# Patient Record
Sex: Female | Born: 1955 | Race: Black or African American | Hispanic: No | Marital: Married | Smoking: Former smoker
Health system: Southern US, Community
[De-identification: ages and names within clinical notes are randomized; demographics above are authoritative.]

## PROBLEM LIST (undated history)

## (undated) DIAGNOSIS — K219 Gastro-esophageal reflux disease without esophagitis: Secondary | ICD-10-CM

---

## 1999-06-06 ENCOUNTER — Other Ambulatory Visit: Admission: RE | Admit: 1999-06-06 | Discharge: 1999-06-06 | Payer: Self-pay | Admitting: Obstetrics & Gynecology

## 1999-09-30 ENCOUNTER — Encounter: Admission: RE | Admit: 1999-09-30 | Discharge: 1999-12-29 | Payer: Self-pay | Admitting: Obstetrics & Gynecology

## 1999-11-23 ENCOUNTER — Encounter (INDEPENDENT_AMBULATORY_CARE_PROVIDER_SITE_OTHER): Payer: Self-pay

## 1999-11-23 ENCOUNTER — Inpatient Hospital Stay (HOSPITAL_COMMUNITY): Admission: AD | Admit: 1999-11-23 | Discharge: 1999-11-26 | Payer: Self-pay | Admitting: Obstetrics & Gynecology

## 1999-11-24 ENCOUNTER — Encounter: Payer: Self-pay | Admitting: Obstetrics and Gynecology

## 2000-08-31 ENCOUNTER — Other Ambulatory Visit: Admission: RE | Admit: 2000-08-31 | Discharge: 2000-08-31 | Payer: Self-pay | Admitting: Obstetrics & Gynecology

## 2001-11-08 ENCOUNTER — Other Ambulatory Visit: Admission: RE | Admit: 2001-11-08 | Discharge: 2001-11-08 | Payer: Self-pay | Admitting: Obstetrics & Gynecology

## 2002-04-13 ENCOUNTER — Emergency Department (HOSPITAL_COMMUNITY): Admission: EM | Admit: 2002-04-13 | Discharge: 2002-04-14 | Payer: Self-pay | Admitting: Emergency Medicine

## 2003-11-13 ENCOUNTER — Other Ambulatory Visit: Admission: RE | Admit: 2003-11-13 | Discharge: 2003-11-13 | Payer: Self-pay | Admitting: Obstetrics & Gynecology

## 2005-10-20 ENCOUNTER — Other Ambulatory Visit: Admission: RE | Admit: 2005-10-20 | Discharge: 2005-10-20 | Payer: Self-pay | Admitting: Obstetrics & Gynecology

## 2009-04-15 ENCOUNTER — Encounter: Admission: RE | Admit: 2009-04-15 | Discharge: 2009-05-21 | Payer: Self-pay | Admitting: Family Medicine

## 2011-03-17 NOTE — Op Note (Signed)
[facility]  Patient:    Rebecca Gill                       MRN: [id number] Proc. Date: 11/23/99 Adm. Date:  [id number] Attending:  Mickle Mallory                           Operative Report  PREOPERATIVE DIAGNOSIS:       Previous cesarean section, voluntary sterilization.  POSTOPERATIVE DIAGNOSIS:      Previous cesarean section, voluntary sterilization.  OPERATION:                    Repeat low transverse cesarean section and bilateral partial salpingectomy for sterilization.  SURGEON:                      Richard D. Arlyce Dice, M.D.  ASSISTANT:                    Luvenia Redden, M.D.  ANESTHESIA:                   Spinal.  ESTIMATED BLOOD LOSS:         500 cc.  FINDINGS:                     A female infant with Apgars of 9 and 9, birth weight 8 pounds 7 ounces, clear amniotic fluid.  Normal appearing tubes and ovaries. Small 1 to 2 cm myoma at the fundus of the uterus.  INDICATIONS:                  This is a 55 year old, gravida 4, para 1, abortus 2, with an estimated date of confinement of February 2, who was admitted for a repeat cesarean section.  The possibility of vaginal birth after cesarean section was discussed with the patient who refused to accept the risks of doing this.  In addition, the patient requests that she have a tubal ligation.  The fact that this was permanent, that the failure rate was approximately 5 per 1000 and that there were nonpermanent alternatives available to the patient was carefully discussed  with her several times prior to surgery.  DESCRIPTION OF PROCEDURE:     The patient was taken to the operating room and spinal anesthesia was placed.  She was then placed in the supine position with eft lateral displacement of the uterus.  The abdomen was prepped and draped in a sterile fashion.  A low vertical incision was made removing the previous low vertical scar.  This incision was carried down to  the fascia.  The fascia was extended vertically.  The rectus muscle was divided in the midline and the rectus sheath and peritoneum were entered by sharp and blunt dissection and this incision was extended vertically.  The lower segment was identified.  Incision was made.  The bladder was displaced inferiorly. The incision was then carried down to the  amniotic cavity and extended with bandage scissors.  The infant was then delivered without difficulty.  Cord bloods were obtained and the placenta was delivered with traction on the cord.  The uterus was bluntly curettaged.  The lower segment was closed with a single layer of running interlocking Vicryl 1 suture.  The left tube was then brought into the incision, grasped with a Kelly clamp, and doubly ligated at the  isthmic/ampullary junction removing a 2 to 3 cm knuckle of tube. Identical procedure was then carried out on the contralateral tube.  The fascia and peritoneum were closed with a running Smead-Jones type closure.  The subcutaneous tissue was reopposed with plain catgut suture and the skin was closed with staples. The patient tolerated the procedure well and left the operating room in good condition. DD:  11/23/99 TD:  11/23/99 Job: [id number]

## 2011-03-17 NOTE — Discharge Summary (Signed)
[facility]  Patient:    Rebecca Gill                       MRN: [id number] Adm. Date:  [id number] Disc. Date: [id number] Attending:  Mickle Mallory Dictator:   Leilani Able, P.A.                           Discharge Summary  FINAL DIAGNOSES:              1. Previous cesarean section, desires repeat cesarean                                  section.                               2. Desires voluntary sterilization.  PROCEDURE:                    Repeat low transverse cesarean section and bilateral partial salpingectomy for sterilization.  SURGEON:                      Richard D. Arlyce Dice, M.D.  ASSISTANT:                    Luvenia Redden, M.D.  COMPLICATIONS:                None.  HISTORY OF PRESENT ILLNESS:   This 55 year old, gravida 4, para 1, was admitted on November 23, 1999, for a repeat cesarean section.  The patient had refused a VBAC and she also requested tubal ligation after this pregnancy.  The risks were discussed with the patient.  HOSPITAL COURSE:              She was taken to the operating room on November 23, 1999, by Caralyn Guile. Arlyce Dice, M.D. where repeat low transverse cesarean section was performed with delivery of an 8 pound 7 ounce female infant with Apgars of 9 and 9. Delivery went without complications.  At this point a bilateral partial salpingectomy for sterilization was performed without complications.  There was a small myoma at the fundus of the uterus noted.  The procedure went without complications.  The patients postoperative course, the patient did have some problems with shortness of breath on her postoperative course.  She was given some Lasix.  There was some serous etiology thought to be connected with the shortness of breath.  She started feeling better.  Her lungs were clear to auscultation. She had a normal cardiac examination.  She was felt ready for discharge on postoperative day #3.  She  was sent home on a regular diet, told to decrease activities.  She was given Tylox #25 one to two every four hours as needed for pain.  She was given Keflex 500 mg one q.i.d. x 7 days for her incision.  She was told to put some heat to her incision because there was some slight erythema noted and she was also to return to the office in 48 hours for a wound check.  DISCHARGE LABORATORY DATA:    Therapy had a hemoglobin of 11.9, white blood cell count 12.2. DD:  12/26/99 TD:  12/26/99 Job: [id number]

## 2013-10-15 ENCOUNTER — Other Ambulatory Visit: Payer: Self-pay | Admitting: Oral Surgery

## 2013-10-15 DIAGNOSIS — M26629 Arthralgia of temporomandibular joint, unspecified side: Secondary | ICD-10-CM

## 2013-10-27 ENCOUNTER — Ambulatory Visit
Admission: RE | Admit: 2013-10-27 | Discharge: 2013-10-27 | Disposition: A | Discharge status: Home / Self Care | Payer: 59 | Source: Ambulatory Visit | Attending: Oral Surgery | Admitting: Oral Surgery

## 2013-10-27 DIAGNOSIS — M26629 Arthralgia of temporomandibular joint, unspecified side: Secondary | ICD-10-CM

## 2015-02-27 ENCOUNTER — Encounter (HOSPITAL_COMMUNITY): Payer: Self-pay

## 2015-02-27 ENCOUNTER — Emergency Department (HOSPITAL_COMMUNITY)
Admission: EM | Admit: 2015-02-27 | Discharge: 2015-02-27 | Disposition: A | Discharge status: Home / Self Care | Payer: 59 | Attending: Emergency Medicine | Admitting: Emergency Medicine

## 2015-02-27 DIAGNOSIS — L509 Urticaria, unspecified: Secondary | ICD-10-CM | POA: Diagnosis not present

## 2015-02-27 DIAGNOSIS — Z8719 Personal history of other diseases of the digestive system: Secondary | ICD-10-CM | POA: Diagnosis not present

## 2015-02-27 DIAGNOSIS — Z87891 Personal history of nicotine dependence: Secondary | ICD-10-CM | POA: Diagnosis not present

## 2015-02-27 DIAGNOSIS — R21 Rash and other nonspecific skin eruption: Secondary | ICD-10-CM | POA: Diagnosis present

## 2015-02-27 HISTORY — DX: Gastro-esophageal reflux disease without esophagitis: K21.9

## 2015-02-27 MED ORDER — FAMOTIDINE 20 MG PO TABS: 20.0000 mg | ORAL_TABLET | Freq: Two times a day (BID) | ORAL | Status: AC

## 2015-02-27 MED ORDER — FAMOTIDINE 20 MG PO TABS
20.0000 mg | ORAL_TABLET | ORAL | Status: AC
  Administered 2015-02-27: 20 mg via ORAL
  Filled 2015-02-27: qty 1

## 2015-02-27 MED ORDER — PREDNISONE 20 MG PO TABS
60.0000 mg | ORAL_TABLET | Freq: Once | ORAL | Status: AC
  Administered 2015-02-27: 60 mg via ORAL
  Filled 2015-02-27: qty 3

## 2015-02-27 MED ORDER — PREDNISONE 10 MG PO TABS: 20.0000 mg | ORAL_TABLET | Freq: Every day | ORAL | Status: AC

## 2015-02-27 NOTE — ED Notes (Signed)
Pt presents with c/o hives that started today. Pt reports the hives started on her leg and are now showing up on her arms and chest and stomach. Pt reports no new foods or detergents. Pt in no respiratory distress.

## 2015-02-27 NOTE — ED Provider Notes (Signed)
CSN: [id number]     Arrival date & time 02/27/15  2021 History  This chart was scribed for Earley FavorGail Maday Guarino, NP working with Azalia BilisKevin Campos, MD by Evon Slackerrance Branch, ED Scribe. This patient was seen in room WTR6/WTR6 and the patient's care was started at 8:33 PM.     Chief Complaint  Patient presents with  . Rash   The history is provided by the patient. No language interpreter was used.   HPI Comments: Rebecca CarlCheryl C Knies is a 59 y.o. female who presents to the Emergency Department complaining of new itchy rash onset this morning. Pt states she has hives that started in her groin area and have spread to her torso and arms. Pt states she has tried benadryl with temporary relief.  Pt denies any new foods or detergents. Pt denies fever SOB or trouble swallowing.   Past Medical History  Diagnosis Date  . GERD (gastroesophageal reflux disease)    History reviewed. No pertinent past surgical history. No family history on file. History  Substance Use Topics  . Smoking status: Former Games developermoker  . Smokeless tobacco: Not on file  . Alcohol Use: Yes     Comment: occasionally    OB History    No data available      Review of Systems  Constitutional: Negative for fever.  HENT: Negative for trouble swallowing and voice change.   Respiratory: Negative for shortness of breath.   Skin: Positive for rash.  All other systems reviewed and are negative.    Allergies  Review of patient's allergies indicates no known allergies.  Home Medications   Prior to Admission medications   Medication Sig Start Date End Date Taking? Authorizing Provider  famotidine (PEPCID) 20 MG tablet Take 1 tablet (20 mg total) by mouth 2 (two) times daily. 02/27/15   Earley FavorGail Jermell Holeman, NP  predniSONE (DELTASONE) 10 MG tablet Take 2 tablets (20 mg total) by mouth daily with breakfast. 02/27/15   Earley FavorGail Jannatul Wojdyla, NP   BP 149/81 mmHg  Pulse 91  Temp(Src) 98.5 F (36.9 C) (Oral)  Resp 18  SpO2 97%   Physical Exam  Constitutional: She is  oriented to person, place, and time. She appears well-developed and well-nourished. No distress.  HENT:  Head: Normocephalic and atraumatic.  Mouth/Throat: Oropharynx is clear and moist. No oropharyngeal exudate.  Eyes: Conjunctivae and EOM are normal.  Neck: Neck supple. No tracheal deviation present.  Cardiovascular: Normal rate.   Pulmonary/Chest: Effort normal. No respiratory distress.  Musculoskeletal: Normal range of motion.  Neurological: She is alert and oriented to person, place, and time.  Skin: Skin is warm and dry.  Psychiatric: She has a normal mood and affect. Her behavior is normal.  Nursing note and vitals reviewed.   ED Course  Procedures (including critical care time) DIAGNOSTIC STUDIES: Oxygen Saturation is 97% on RA, normal by my interpretation.    COORDINATION OF CARE: 8:50 PM-Discussed treatment plan with pt at bedside and pt agreed to plan.     Labs Review Labs Reviewed - No data to display  Imaging Review No results found.   EKG Interpretation None      MDM   Final diagnoses:  Hives      I personally performed the services described in this documentation, which was scribed in my presence. The recorded information has been reviewed and is accurate.    Earley FavorGail Emogene Muratalla, NP 02/27/15 2128  Azalia BilisKevin Campos, MD 02/27/15 [phone]

## 2015-02-27 NOTE — Discharge Instructions (Signed)
Return for shortness of breath, rapid heart rate, nausea, vomiting, abdominal pain, voice change, tongue swelling

## 2015-02-28 ENCOUNTER — Encounter (HOSPITAL_COMMUNITY): Payer: Self-pay

## 2015-02-28 ENCOUNTER — Emergency Department (HOSPITAL_COMMUNITY)
Admission: EM | Admit: 2015-02-28 | Discharge: 2015-02-28 | Disposition: A | Discharge status: Home / Self Care | Payer: 59 | Attending: Emergency Medicine | Admitting: Emergency Medicine

## 2015-02-28 DIAGNOSIS — X58XXXA Exposure to other specified factors, initial encounter: Secondary | ICD-10-CM | POA: Insufficient documentation

## 2015-02-28 DIAGNOSIS — L500 Allergic urticaria: Secondary | ICD-10-CM | POA: Insufficient documentation

## 2015-02-28 DIAGNOSIS — Z7952 Long term (current) use of systemic steroids: Secondary | ICD-10-CM | POA: Diagnosis not present

## 2015-02-28 DIAGNOSIS — Z79899 Other long term (current) drug therapy: Secondary | ICD-10-CM | POA: Insufficient documentation

## 2015-02-28 DIAGNOSIS — Y9289 Other specified places as the place of occurrence of the external cause: Secondary | ICD-10-CM | POA: Diagnosis not present

## 2015-02-28 DIAGNOSIS — K219 Gastro-esophageal reflux disease without esophagitis: Secondary | ICD-10-CM | POA: Insufficient documentation

## 2015-02-28 DIAGNOSIS — Y9389 Activity, other specified: Secondary | ICD-10-CM | POA: Diagnosis not present

## 2015-02-28 DIAGNOSIS — Y998 Other external cause status: Secondary | ICD-10-CM | POA: Insufficient documentation

## 2015-02-28 DIAGNOSIS — T7840XA Allergy, unspecified, initial encounter: Secondary | ICD-10-CM | POA: Diagnosis not present

## 2015-02-28 DIAGNOSIS — Z87891 Personal history of nicotine dependence: Secondary | ICD-10-CM | POA: Insufficient documentation

## 2015-02-28 MED ORDER — RANITIDINE HCL 150 MG/10ML PO SYRP
150.0000 mg | ORAL_SOLUTION | Freq: Once | ORAL | Status: AC
  Administered 2015-02-28: 150 mg via ORAL
  Filled 2015-02-28: qty 10

## 2015-02-28 MED ORDER — DIPHENHYDRAMINE HCL 50 MG/ML IJ SOLN
50.0000 mg | Freq: Once | INTRAMUSCULAR | Status: AC
  Administered 2015-02-28: 50 mg via INTRAVENOUS
  Filled 2015-02-28: qty 1

## 2015-02-28 NOTE — ED Notes (Signed)
She has widespread pruritic urticarial rash which is not improving despite steroids/H2 blockers prescribed (was seen here yesterday).  She denies any respiratory sx nor any throat swelling and is in no distress.

## 2015-02-28 NOTE — ED Notes (Signed)
Rash to both legs especially lowere legs.  Reports she just came here last night for same thing.  She reports possibly eating some oriental food that "didn't taste right"

## 2015-02-28 NOTE — ED Provider Notes (Signed)
CSN: [id number]     Arrival date & time 02/28/15  1034 History   First MD Initiated Contact with Patient 02/28/15 1124     Chief Complaint  Patient presents with  . Allergic Reaction     (Consider location/radiation/quality/duration/timing/severity/associated sxs/prior Treatment) HPI Comments: Pt states that she was seen in the er last night and given zantac and prednisone. Pt states that she was doing a little better but then the hives worsened today. Denies any problem swallowing, facial swelling or problems breathing. Has been using benadryl cream but has not had oral or iv benadryl. No fever.  Patient is a 59 y.o. female presenting with allergic reaction. The history is provided by the patient. No language interpreter was used.  Allergic Reaction Presenting symptoms: itching and rash   Severity:  Moderate Prior allergic episodes:  No prior episodes Context: food   Relieved by:  Nothing Ineffective treatments:  OTC ointments and steroids   Past Medical History  Diagnosis Date  . GERD (gastroesophageal reflux disease)    No past surgical history on file. No family history on file. History  Substance Use Topics  . Smoking status: Former Games developermoker  . Smokeless tobacco: Not on file  . Alcohol Use: Yes     Comment: occasionally    OB History    No data available     Review of Systems  Skin: Positive for itching and rash.  All other systems reviewed and are negative.     Allergies  Review of patient's allergies indicates no known allergies.  Home Medications   Prior to Admission medications   Medication Sig Start Date End Date Taking? Authorizing Provider  famotidine (PEPCID) 20 MG tablet Take 1 tablet (20 mg total) by mouth 2 (two) times daily. 02/27/15   Earley FavorGail Schulz, NP  predniSONE (DELTASONE) 10 MG tablet Take 2 tablets (20 mg total) by mouth daily with breakfast. 02/27/15   Earley FavorGail Schulz, NP   BP 102/73 mmHg  Pulse 102  Temp(Src) 97.5 F (36.4 C) (Oral)  Resp 18   SpO2 100% Physical Exam  Constitutional: She is oriented to person, place, and time. She appears well-developed.  HENT:  Right Ear: External ear normal.  Left Ear: External ear normal.  Mouth/Throat: Oropharynx is clear and moist.  Eyes: Conjunctivae and EOM are normal. Pupils are equal, round, and reactive to light.  Cardiovascular: Normal rate and regular rhythm.   Pulmonary/Chest: Effort normal and breath sounds normal.  Musculoskeletal: Normal range of motion.  Neurological: She is alert and oriented to person, place, and time. Coordination normal.  Skin:  Hives noted from the neck down  Nursing note and vitals reviewed.   ED Course  Procedures (including critical care time) Labs Review Labs Reviewed - No data to display  Imaging Review No results found.   EKG Interpretation None      MDM   Final diagnoses:  Allergic reaction, initial encounter    Hives have resolved. Pt has prednisone and zantac home. Discussed use of benadryl with pt   Teressa LowerVrinda Endi Lagman, NP 02/28/15 1351  Pricilla LovelessScott Goldston, MD 03/04/15 2334

## 2015-02-28 NOTE — Discharge Instructions (Signed)
Make sure that you get benadryl for recurrence

## 2015-07-23 ENCOUNTER — Other Ambulatory Visit: Payer: Self-pay | Admitting: Obstetrics & Gynecology

## 2015-07-23 DIAGNOSIS — N644 Mastodynia: Secondary | ICD-10-CM

## 2015-07-27 ENCOUNTER — Ambulatory Visit
Admission: RE | Admit: 2015-07-27 | Discharge: 2015-07-27 | Disposition: A | Discharge status: Home / Self Care | Payer: 59 | Source: Ambulatory Visit | Attending: Obstetrics & Gynecology | Admitting: Obstetrics & Gynecology

## 2015-07-27 DIAGNOSIS — N644 Mastodynia: Secondary | ICD-10-CM

## 2018-10-15 ENCOUNTER — Other Ambulatory Visit: Payer: Self-pay | Admitting: Obstetrics & Gynecology

## 2018-10-15 DIAGNOSIS — N6310 Unspecified lump in the right breast, unspecified quadrant: Secondary | ICD-10-CM

## 2018-10-21 ENCOUNTER — Ambulatory Visit
Admission: RE | Admit: 2018-10-21 | Discharge: 2018-10-21 | Disposition: A | Discharge status: Home / Self Care | Payer: 59 | Source: Ambulatory Visit | Attending: Obstetrics & Gynecology | Admitting: Obstetrics & Gynecology

## 2018-10-21 DIAGNOSIS — N6310 Unspecified lump in the right breast, unspecified quadrant: Secondary | ICD-10-CM

## 2021-04-04 ENCOUNTER — Other Ambulatory Visit: Payer: Self-pay | Admitting: Obstetrics & Gynecology

## 2021-04-04 DIAGNOSIS — Z1231 Encounter for screening mammogram for malignant neoplasm of breast: Secondary | ICD-10-CM

## 2021-04-14 ENCOUNTER — Ambulatory Visit
Admission: RE | Admit: 2021-04-14 | Discharge: 2021-04-14 | Disposition: A | Discharge status: Home / Self Care | Payer: 59 | Source: Ambulatory Visit | Attending: Obstetrics & Gynecology | Admitting: Obstetrics & Gynecology

## 2021-04-14 ENCOUNTER — Other Ambulatory Visit: Payer: Self-pay

## 2021-04-14 DIAGNOSIS — Z1231 Encounter for screening mammogram for malignant neoplasm of breast: Secondary | ICD-10-CM

## 2022-06-21 ENCOUNTER — Other Ambulatory Visit: Payer: Self-pay | Admitting: Obstetrics & Gynecology

## 2022-06-21 DIAGNOSIS — Z1231 Encounter for screening mammogram for malignant neoplasm of breast: Secondary | ICD-10-CM

## 2022-06-23 ENCOUNTER — Ambulatory Visit: Payer: 59

## 2022-07-13 ENCOUNTER — Ambulatory Visit
Admission: RE | Admit: 2022-07-13 | Discharge: 2022-07-13 | Disposition: A | Discharge status: Home / Self Care | Payer: 59 | Source: Ambulatory Visit | Attending: Obstetrics & Gynecology | Admitting: Obstetrics & Gynecology

## 2022-07-13 DIAGNOSIS — Z1231 Encounter for screening mammogram for malignant neoplasm of breast: Secondary | ICD-10-CM

## 2022-12-07 IMAGING — MG MM DIGITAL SCREENING BILAT W/ TOMO AND CAD
6 of 12 series · 6 of 36 positions shown · non-contrast
Comparison: Previous exam(s).

ACR Breast Density Category a: The breast tissue is almost entirely
fatty.

CLINICAL DATA: Screening.

EXAM:
DIGITAL SCREENING BILATERAL MAMMOGRAM WITH TOMOSYNTHESIS AND CAD
TECHNIQUE: Bilateral screening digital craniocaudal and mediolateral oblique
mammograms were obtained. Bilateral screening digital breast
tomosynthesis was performed. The images were evaluated with
computer-aided detection.

[L CC synth-2D (1 of 2)]
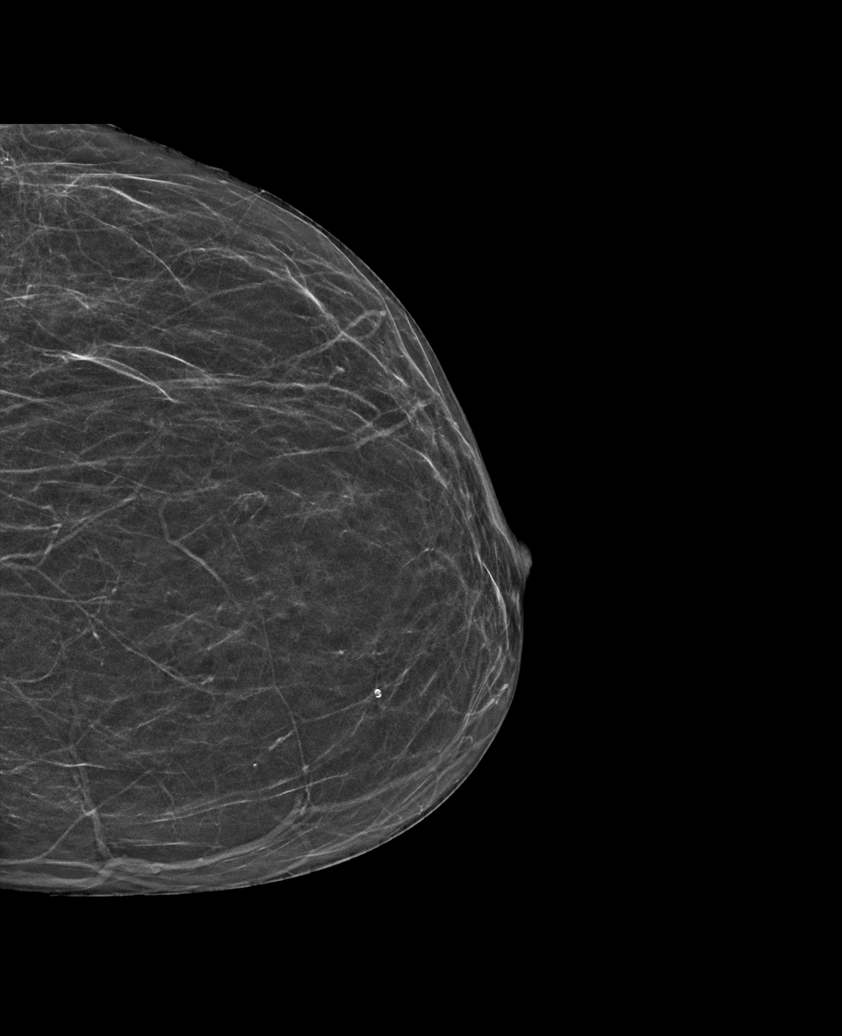

[L CC synth-2D (2 of 2)]
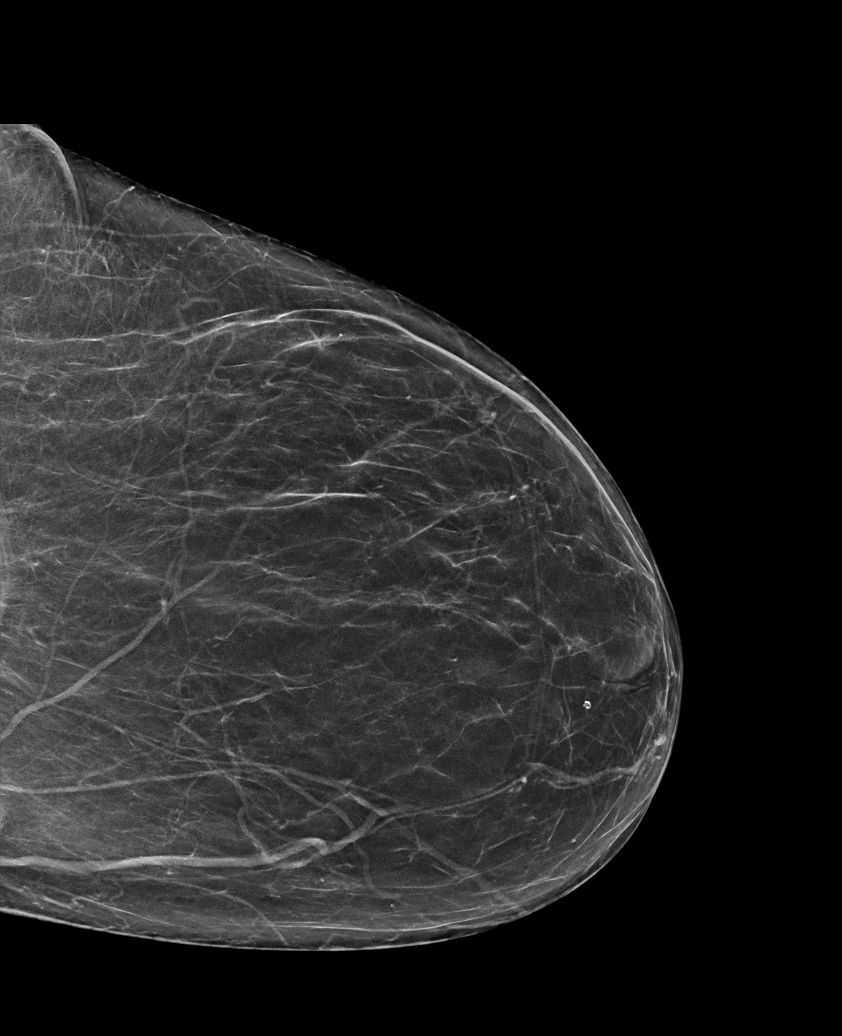

[R CC synth-2D (1 of 2)]
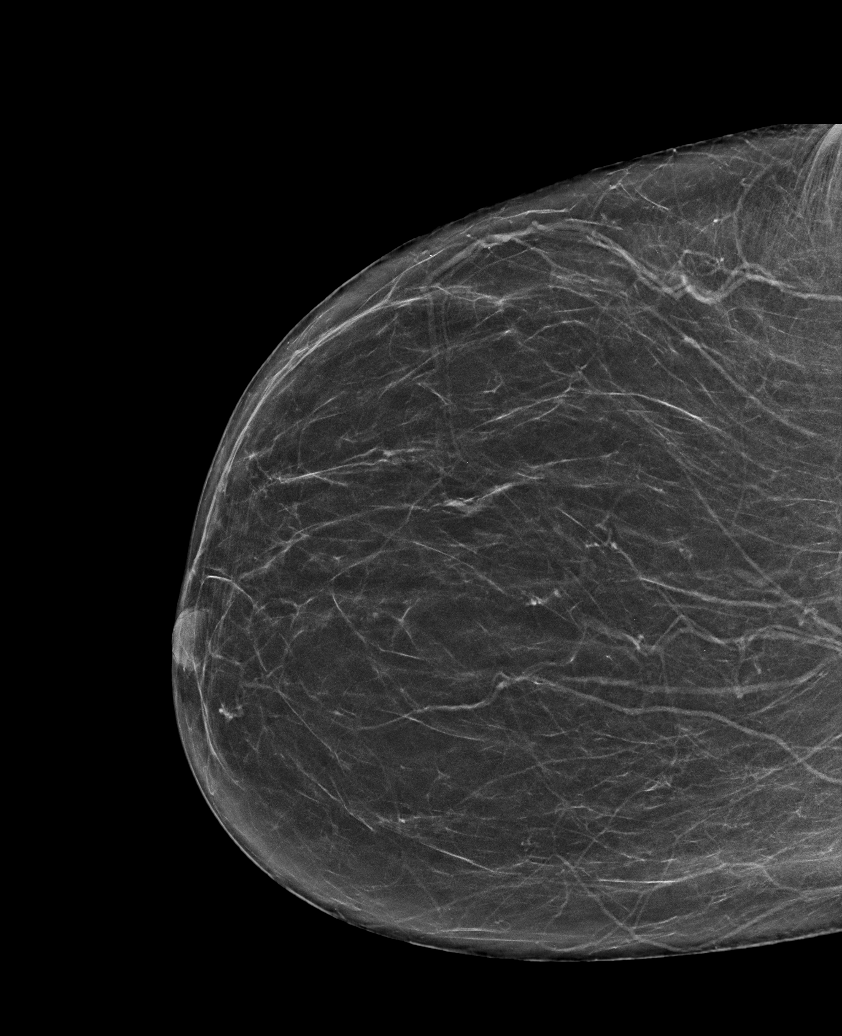

[L MLO synth-2D]
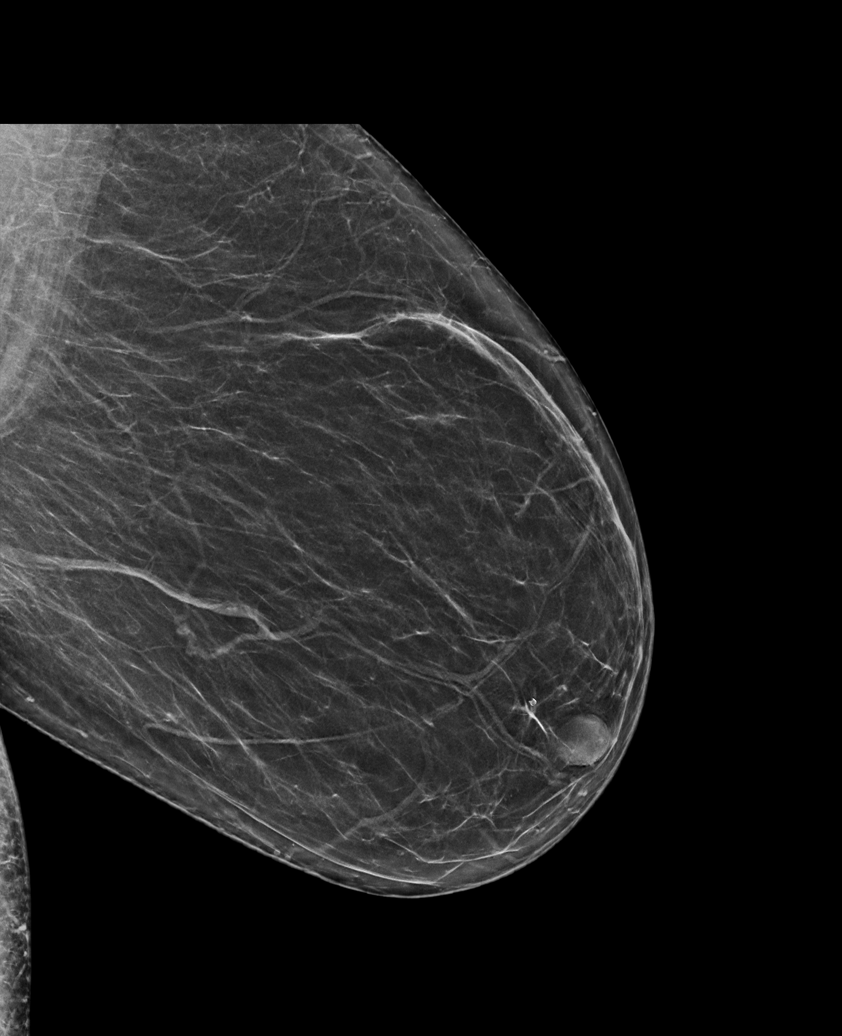

[R CC synth-2D (2 of 2)]
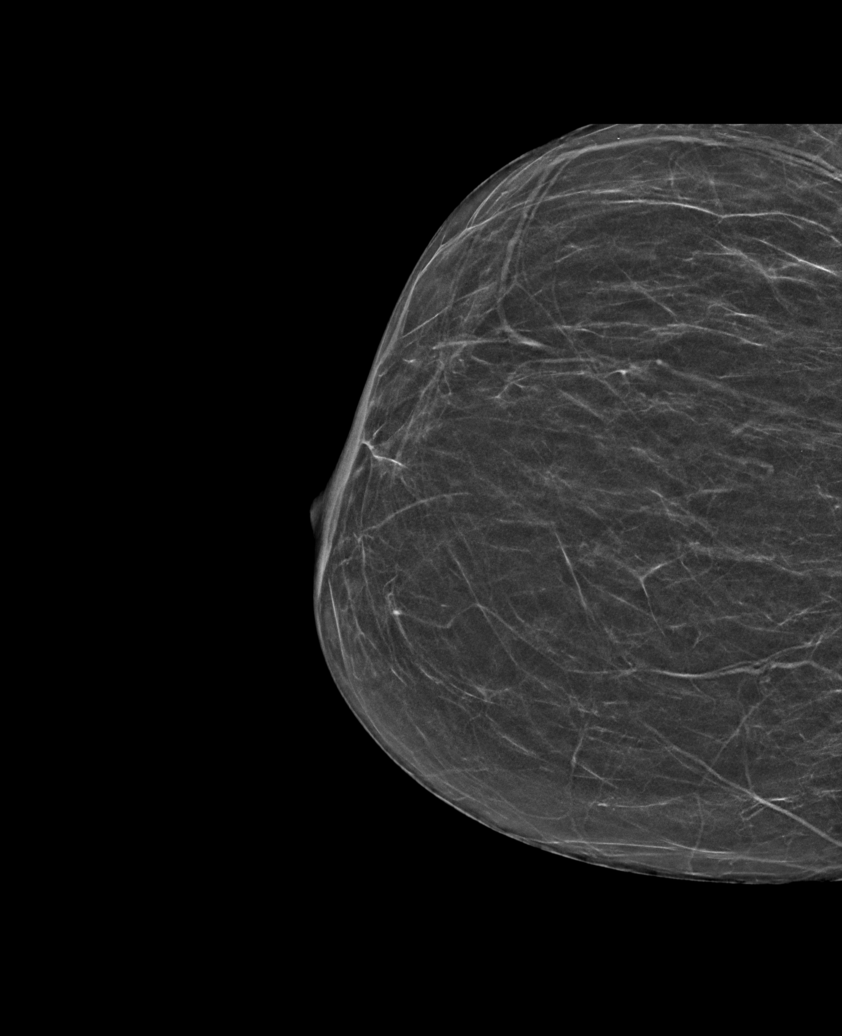

[R MLO synth-2D]
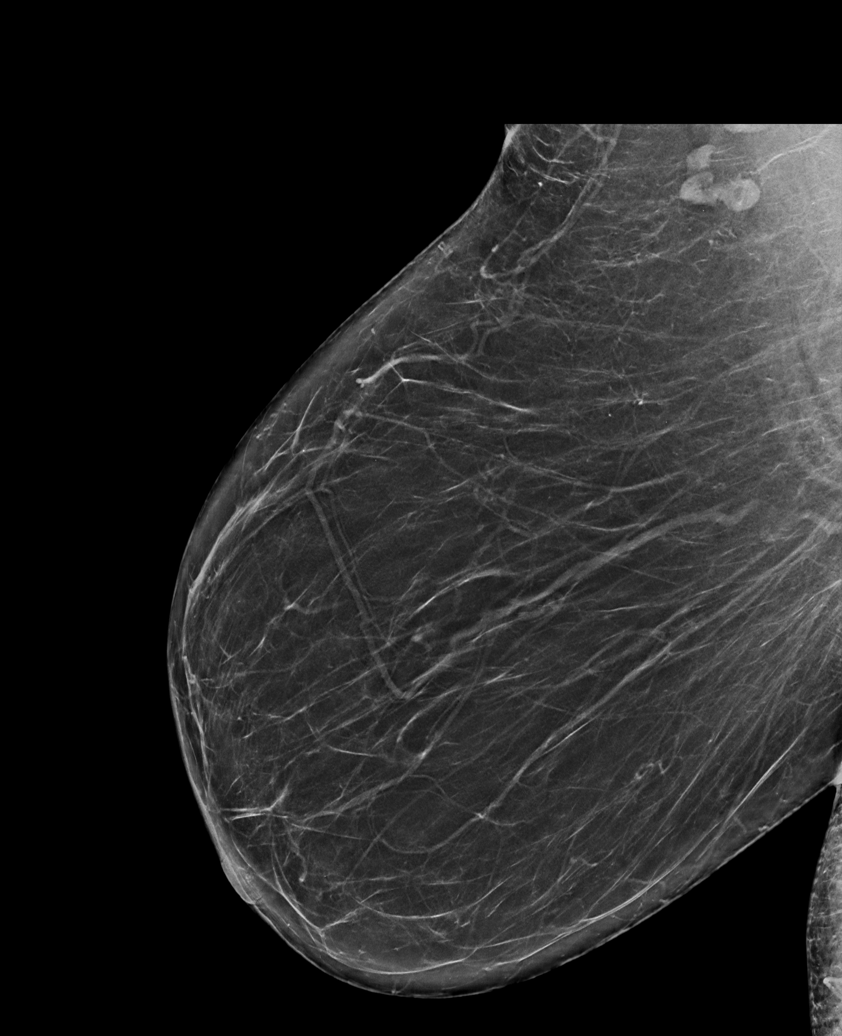

[6 of 36 positions shown; findings below may reference images not displayed]

FINDINGS: There are no findings suspicious for malignancy.
IMPRESSION: No mammographic evidence of malignancy. A result letter of this
screening mammogram will be mailed directly to the patient.

RECOMMENDATION:
Screening mammogram in one year. (Code:0E-3-N98)

BI-RADS CATEGORY  1: Negative.

## 2023-07-19 ENCOUNTER — Other Ambulatory Visit: Payer: Self-pay | Admitting: Obstetrics & Gynecology

## 2023-07-19 DIAGNOSIS — N644 Mastodynia: Secondary | ICD-10-CM

## 2023-08-02 ENCOUNTER — Encounter: Payer: Self-pay | Admitting: Obstetrics & Gynecology

## 2023-08-02 ENCOUNTER — Ambulatory Visit
Admission: RE | Admit: 2023-08-02 | Discharge: 2023-08-02 | Disposition: A | Discharge status: Home / Self Care | Payer: 59 | Source: Ambulatory Visit | Attending: Obstetrics & Gynecology | Admitting: Obstetrics & Gynecology

## 2023-08-02 ENCOUNTER — Other Ambulatory Visit: Payer: Self-pay | Admitting: Obstetrics & Gynecology

## 2023-08-02 DIAGNOSIS — N644 Mastodynia: Secondary | ICD-10-CM

## 2024-08-29 ENCOUNTER — Encounter: Payer: Self-pay | Admitting: Obstetrics & Gynecology

## 2024-08-29 DIAGNOSIS — Z1231 Encounter for screening mammogram for malignant neoplasm of breast: Secondary | ICD-10-CM

## 2024-09-05 ENCOUNTER — Other Ambulatory Visit: Payer: Self-pay | Admitting: Obstetrics & Gynecology

## 2024-09-05 DIAGNOSIS — N644 Mastodynia: Secondary | ICD-10-CM

## 2024-09-30 ENCOUNTER — Ambulatory Visit

## 2024-09-30 ENCOUNTER — Ambulatory Visit
Admission: RE | Admit: 2024-09-30 | Discharge: 2024-09-30 | Disposition: A | Source: Ambulatory Visit | Attending: Obstetrics & Gynecology | Admitting: Obstetrics & Gynecology

## 2024-09-30 DIAGNOSIS — N644 Mastodynia: Secondary | ICD-10-CM
